# Patient Record
Sex: Male | Born: 1990 | Hispanic: No | Marital: Single | Smoking: Current every day smoker
Health system: Southern US, Community
[De-identification: ages and names within clinical notes are randomized; demographics above are authoritative.]

---

## 2014-06-29 ENCOUNTER — Emergency Department (HOSPITAL_COMMUNITY): Payer: Self-pay

## 2014-06-29 ENCOUNTER — Encounter (HOSPITAL_COMMUNITY): Payer: Self-pay | Admitting: Emergency Medicine

## 2014-06-29 ENCOUNTER — Emergency Department (HOSPITAL_COMMUNITY)
Admission: EM | Admit: 2014-06-29 | Discharge: 2014-06-29 | Disposition: A | Discharge status: Home / Self Care | Payer: Self-pay | Attending: Emergency Medicine | Admitting: Emergency Medicine

## 2014-06-29 DIAGNOSIS — Y92321 Football field as the place of occurrence of the external cause: Secondary | ICD-10-CM | POA: Insufficient documentation

## 2014-06-29 DIAGNOSIS — Y9361 Activity, american tackle football: Secondary | ICD-10-CM | POA: Insufficient documentation

## 2014-06-29 DIAGNOSIS — Y998 Other external cause status: Secondary | ICD-10-CM | POA: Insufficient documentation

## 2014-06-29 DIAGNOSIS — Z720 Tobacco use: Secondary | ICD-10-CM | POA: Insufficient documentation

## 2014-06-29 DIAGNOSIS — S40012A Contusion of left shoulder, initial encounter: Secondary | ICD-10-CM | POA: Insufficient documentation

## 2014-06-29 DIAGNOSIS — W500XXA Accidental hit or strike by another person, initial encounter: Secondary | ICD-10-CM | POA: Insufficient documentation

## 2014-06-29 MED ORDER — HYDROCODONE-ACETAMINOPHEN 5-325 MG PO TABS
2.0000 | ORAL_TABLET | Freq: Once | ORAL | Status: AC
  Administered 2014-06-29: 2 via ORAL
  Filled 2014-06-29 (×2): qty 2

## 2014-06-29 MED ORDER — IBUPROFEN 600 MG PO TABS: 600.0000 mg | ORAL_TABLET | Freq: Four times a day (QID) | ORAL | Status: DC | PRN

## 2014-06-29 MED ORDER — IBUPROFEN 200 MG PO TABS
600.0000 mg | ORAL_TABLET | Freq: Once | ORAL | Status: AC
  Administered 2014-06-29: 600 mg via ORAL
  Filled 2014-06-29: qty 3

## 2014-06-29 MED ORDER — HYDROCODONE-ACETAMINOPHEN 5-325 MG PO TABS: 1.0000 | ORAL_TABLET | Freq: Four times a day (QID) | ORAL | Status: DC | PRN

## 2014-06-29 NOTE — Discharge Instructions (Signed)
The Xrays are normal. We suspect cone bruise or contusion. We recommend RICE treatment. No contact sports for 2 weeks or until cleared by Orthopedics or trainer.   Contusion A contusion is a deep bruise. Contusions are the result of an injury that caused bleeding under the skin. The contusion may turn blue, purple, or yellow. Minor injuries will give you a painless contusion, but more severe contusions may stay painful and swollen for a few weeks.  CAUSES  A contusion is usually caused by a blow, trauma, or direct force to an area of the body. SYMPTOMS   Swelling and redness of the injured area.  Bruising of the injured area.  Tenderness and soreness of the injured area.  Pain. DIAGNOSIS  The diagnosis can be made by taking a history and physical exam. An X-ray, CT scan, or MRI may be needed to determine if there were any associated injuries, such as fractures. TREATMENT  Specific treatment will depend on what area of the body was injured. In general, the best treatment for a contusion is resting, icing, elevating, and applying cold compresses to the injured area. Over-the-counter medicines may also be recommended for pain control. Ask your caregiver what the best treatment is for your contusion. HOME CARE INSTRUCTIONS   Put ice on the injured area.  Put ice in a plastic bag.  Place a towel between your skin and the bag.  Leave the ice on for 15-20 minutes, 3-4 times a day, or as directed by your health care provider.  Only take over-the-counter or prescription medicines for pain, discomfort, or fever as directed by your caregiver. Your caregiver may recommend avoiding anti-inflammatory medicines (aspirin, ibuprofen, and naproxen) for 48 hours because these medicines may increase bruising.  Rest the injured area.  If possible, elevate the injured area to reduce swelling. SEEK IMMEDIATE MEDICAL CARE IF:   You have increased bruising or swelling.  You have pain that is getting  worse.  Your swelling or pain is not relieved with medicines. MAKE SURE YOU:   Understand these instructions.  Will watch your condition.  Will get help right away if you are not doing well or get worse. Document Released: 10/27/2004 Document Revised: 01/22/2013 Document Reviewed: 11/22/2010 [facility] Patient Information 2015 [employer], [place]. This information is not intended to replace advice given to you by your health care provider. Make sure you discuss any questions you have with your health care provider. RICE: Routine Care for Injuries The routine care of many injuries includes Rest, Ice, Compression, and Elevation (RICE). HOME CARE INSTRUCTIONS  Rest is needed to allow your body to heal. Routine activities can usually be resumed when comfortable. Injured tendons and bones can take up to 6 weeks to heal. Tendons are the cord-like structures that attach muscle to bone.  Ice following an injury helps keep the swelling down and reduces pain.  Put ice in a plastic bag.  Place a towel between your skin and the bag.  Leave the ice on for 15-20 minutes, 3-4 times a day, or as directed by your health care provider. Do this while awake, for the first 24 to 48 hours. After that, continue as directed by your caregiver.  Compression helps keep swelling down. It also gives support and helps with discomfort. If an elastic bandage has been applied, it should be removed and reapplied every 3 to 4 hours. It should not be applied tightly, but firmly enough to keep swelling down. Watch fingers or toes for swelling, bluish discoloration,  coldness, numbness, or excessive pain. If any of these problems occur, remove the bandage and reapply loosely. Contact your caregiver if these problems continue.  Elevation helps reduce swelling and decreases pain. With extremities, such as the arms, hands, legs, and feet, the injured area should be placed near or above the level of the heart, if possible. SEEK  IMMEDIATE MEDICAL CARE IF:  You have persistent pain and swelling.  You develop redness, numbness, or unexpected weakness.  Your symptoms are getting worse rather than improving after several days. These symptoms may indicate that further evaluation or further X-rays are needed. Sometimes, X-rays may not show a small broken bone (fracture) until 1 week or 10 days later. Make a follow-up appointment with your caregiver. Ask when your X-ray results will be ready. Make sure you get your X-ray results. Document Released: 05/01/2000 Document Revised: 01/22/2013 Document Reviewed: 06/18/2010 [facility] Patient Information 2015 [place], [place]. This information is not intended to replace advice given to you by your health care provider. Make sure you discuss any questions you have with your health care provider.

## 2014-06-29 NOTE — ED Notes (Signed)
Pt. reports left shoulder injury sustained yesterday while playing football with pain and mild swelling .

## 2014-06-29 NOTE — ED Provider Notes (Signed)
CSN: [id number]     Arrival date & time 06/29/14  0134 History  This chart was scribed for Benjamin KaplanAnkit Sovereign Ramiro, MD by Phillis HaggisGabriella Gaje, ED Scribe. This patient was seen in room A08C/A08C and patient care was started at 2:09 AM.   Chief Complaint  Patient presents with  . Shoulder Injury   The history is provided by the patient. No language interpreter was used.  HPI Comments: Benjamin Santos is a 24 y.o. male who presents to the Emergency Department complaining of left shoulder injury onset a few hours ago. He reports that he was hit during football and could not move or feel his arm. He states that the numbness has subsided but the pain persists. He denies history of surgery to the area. He denies taking any medications; states he came straight to the ED from the game. He reports history of a stinger to the area, but denies history of any other injuries to the shoulder. Patient states that he is right hand dominant.   History reviewed. No pertinent past medical history. History reviewed. No pertinent past surgical history. No family history on file. History  Substance Use Topics  . Smoking status: Current Every Day Smoker  . Smokeless tobacco: Not on file  . Alcohol Use: No    Review of Systems  Musculoskeletal: Positive for arthralgias. Negative for joint swelling.  Skin: Negative for wound.  Neurological: Negative for weakness and numbness.   Allergies  Review of patient's allergies indicates no known allergies.  Home Medications   Prior to Admission medications   Medication Sig Start Date End Date Taking? Authorizing Provider  HYDROcodone-acetaminophen (NORCO/VICODIN) 5-325 MG per tablet Take 1 tablet by mouth every 6 (six) hours as needed for severe pain. 06/29/14   Benjamin KaplanAnkit Ruthie Berch, MD  ibuprofen (ADVIL,MOTRIN) 600 MG tablet Take 1 tablet (600 mg total) by mouth every 6 (six) hours as needed. 06/29/14   Kindall Swaby, MD   BP 103/56 mmHg  Pulse 71  Temp(Src) 98.2 F (36.8 C) (Oral)   Resp 22  Wt 180 lb (81.647 kg)  SpO2 99%   Physical Exam  Constitutional: He is oriented to person, place, and time. He appears well-developed and well-nourished.  HENT:  Head: Normocephalic and atraumatic.  Eyes: EOM are normal.  Neck: Normal range of motion. Neck supple.  Cardiovascular: Normal rate.   Pulses:      Radial pulses are 2+ on the left side.  2+ ulnar pulse  Pulmonary/Chest: Effort normal.  Musculoskeletal: Normal range of motion.       Left shoulder: He exhibits tenderness. He exhibits no deformity.  No gross deformity of left upper and lower extremity; grossly sensory exam appears to be equal to contra lateral side. Mild swelling to anterior shoulder; tenderness over AC joint; no posterior shoulder or GH joint tenderness.   Neurological: He is alert and oriented to person, place, and time.  Skin: Skin is warm and dry.  Psychiatric: He has a normal mood and affect. His behavior is normal.  Nursing note and vitals reviewed.   ED Course  Procedures (including critical care time) DIAGNOSTIC STUDIES: Oxygen Saturation is 99% on room air, normal by my interpretation.    COORDINATION OF CARE: 2:13 AM-Discussed treatment plan which includes x-ray and pain medication with pt at bedside and pt agreed to plan.   Labs Review Labs Reviewed - No data to display  Imaging Review Dg Shoulder Left  06/29/2014   CLINICAL DATA:  Acute onset of left shoulder  pain, status post fall while playing football. Initial encounter.  EXAM: LEFT SHOULDER - 2+ VIEW  COMPARISON:  None.  FINDINGS: There is no evidence of fracture or dislocation. The left humeral head is seated within the glenoid fossa. The acromioclavicular joint is unremarkable in appearance. No significant soft tissue abnormalities are seen. The visualized portions of the left lung are clear.  IMPRESSION: No evidence of fracture or dislocation.   Electronically Signed   By: Roanna Raider M.D.   On: 06/29/2014 02:49     EKG  Interpretation None      MDM   Final diagnoses:  Shoulder contusion, left, initial encounter    I personally performed the services described in this documentation, which was scribed in my presence. The recorded information has been reviewed and is accurate.  Pt comes in with L sided pain, shoulder. Blunt trauma whilst playing football earlier. Concerns for AC shoulder injury per exam - xrays neg. Will d/c with ortho f/u.    Benjamin Kaplan, MD 06/29/14 [phone]

## 2015-04-12 ENCOUNTER — Emergency Department (HOSPITAL_COMMUNITY)
Admission: EM | Admit: 2015-04-12 | Discharge: 2015-04-12 | Disposition: A | Discharge status: Home / Self Care | Payer: Self-pay | Attending: Emergency Medicine | Admitting: Emergency Medicine

## 2015-04-12 ENCOUNTER — Encounter (HOSPITAL_COMMUNITY): Payer: Self-pay | Admitting: *Deleted

## 2015-04-12 ENCOUNTER — Emergency Department (HOSPITAL_COMMUNITY): Payer: Self-pay

## 2015-04-12 DIAGNOSIS — F17200 Nicotine dependence, unspecified, uncomplicated: Secondary | ICD-10-CM | POA: Insufficient documentation

## 2015-04-12 DIAGNOSIS — Y92321 Football field as the place of occurrence of the external cause: Secondary | ICD-10-CM | POA: Insufficient documentation

## 2015-04-12 DIAGNOSIS — S43102A Unspecified dislocation of left acromioclavicular joint, initial encounter: Secondary | ICD-10-CM

## 2015-04-12 DIAGNOSIS — W01198A Fall on same level from slipping, tripping and stumbling with subsequent striking against other object, initial encounter: Secondary | ICD-10-CM | POA: Insufficient documentation

## 2015-04-12 DIAGNOSIS — Y9361 Activity, american tackle football: Secondary | ICD-10-CM | POA: Insufficient documentation

## 2015-04-12 DIAGNOSIS — Y998 Other external cause status: Secondary | ICD-10-CM | POA: Insufficient documentation

## 2015-04-12 MED ORDER — HYDROCODONE-ACETAMINOPHEN 5-325 MG PO TABS: 2.0000 | ORAL_TABLET | ORAL | Status: DC | PRN

## 2015-04-12 NOTE — ED Notes (Signed)
The pt is c/o pain in his lt shoulder.  He injured it last pm playing football.  Injuries in the past to the same shoulder.  No deformity

## 2015-04-12 NOTE — Discharge Instructions (Signed)
Acromioclavicular Separation With Rehab The acromioclavicular joint is the joint between the roof of the shoulder (acromion) and the collarbone (clavicle). It is vulnerable to injury. An acromioclavicular [facility]) separation is a partial or complete tear (sprain), injury, or redness and soreness (inflammation) of the ligaments that cross the acromioclavicular joint and hold it in place. There are two ligaments in this area that are vulnerable to injury, the acromioclavicular ligament and the coracoclavicular ligament. SYMPTOMS   Tenderness and swelling, or a bump on top of the shoulder (at the [facility] joint).  Bruising (contusion) in the area within 48 hours of injury.  Loss of strength or pain when reaching over the head or across the body. CAUSES  AC separation is caused by direct trauma to the joint (falling on your shoulder) or indirect trauma (falling on an outstretched arm). RISK INCREASES WITH:  Sports that require contact or collision, throwing sports (i.e. racquetball, squash).  Poor strength and flexibility.  Previous shoulder sprain or dislocation.  Poorly fitted or padded protective equipment. PREVENTION   Warm-up and stretch properly before activity.  Maintain physical fitness:  Shoulder strength.  Shoulder flexibility.  Cardiovascular fitness.  Wear properly fitted and padded protective equipment.  Learn and use proper technique when playing sports. Have a coach correct improper technique, including falling and landing.  Apply taping, protective strapping or padding, or an adhesive bandage as recommended before practice or competition. PROGNOSIS   If treated properly, the symptoms of AC separation can be expected to go away.  If treated improperly, permanent disability may occur unless surgery is performed.  Healing time varies with type of sport and position, arm injured (dominant versus non-dominant) and severity of sprain. RELATED COMPLICATIONS  Weakness and  fatigue of the arm or shoulder are possible but uncommon.  Pain and inflammation of the [facility] joint may continue.  Prolonged healing time may be necessary if usual activities are resumed too early. This causes a susceptibility to recurrent injury.  Prolonged disability may occur.  The shoulder may remain unstable or arthritic following repeated injury. TREATMENT  Treatment initially involves ice and medication to help reduce pain and inflammation. It may also be necessary to modify your activities in order to prevent further injury. Both non-surgical and surgical interventions exist to treat AC separation. Non-surgical intervention is usually recommended and involves wearing a sling to immobilize the joint for a period of time to allow for healing. Surgical intervention is usually only considered for severe sprains of the ligament or for individuals who do not improve after 2 to 6 months of non-surgical treatment. Surgical interventions require 4 to 6 months before a return to sports is possible. MEDICATION  If pain medication is necessary, nonsteroidal anti-inflammatory medications, such as aspirin and ibuprofen, or other minor pain relievers, such as acetaminophen, are often recommended.  Do not take pain medication for 7 days before surgery.  Prescription pain relievers may be given by your caregiver. Use only as directed and only as much as you need.  Ointments applied to the skin may be helpful.  Corticosteroid injections may be given to reduce inflammation. HEAT AND COLD  Cold treatment (icing) relieves pain and reduces inflammation. Cold treatment should be applied for 10 to 15 minutes every 2 to 3 hours for inflammation and pain and immediately after any activity that aggravates your symptoms. Use ice packs or an ice massage.  Heat treatment may be used prior to performing the stretching and strengthening activities prescribed by your caregiver, physical therapist or  Warehouse manager. Use a heat pack or a warm soak. SEEK IMMEDIATE MEDICAL CARE IF:   Pain, swelling or bruising worsens despite treatment.  There is pain, numbness or coldness in the arm.  Discoloration appears in the fingernails.  New, unexplained symptoms develop. EXERCISES  RANGE OF MOTION (ROM) AND STRETCHING EXERCISES - Acromioclavicular Separation These exercises may help you when beginning to rehabilitate your injury. Your symptoms may resolve with or without further involvement from your physician, physical therapist or athletic trainer. While completing these exercises, remember:  Restoring tissue flexibility helps normal motion to return to the joints. This allows healthier, less painful movement and activity.  An effective stretch should be held for at least 30 seconds.  A stretch should never be painful. You should only feel a gentle lengthening or release in the stretched tissue. ROM - Pendulum  Bend at the waist so that your right / left arm falls away from your body. Support yourself with your opposite hand on a solid surface, such as a table or a countertop.  Your right / left arm should be perpendicular to the ground. If it is not perpendicular, you need to lean over farther. Relax the muscles in your right / left arm and shoulder as much as possible.  Gently sway your hips and trunk so they move your right / left arm without any use of your right / left shoulder muscles.  Progress your movements so that your right / left arm moves side to side, then forward and backward, and finally, both clockwise and counterclockwise.  Complete __________ repetitions in each direction. Many people use this exercise to relieve discomfort in their shoulder as well as to gain range of motion. Repeat __________ times. Complete this exercise __________ times per day. STRETCH - Flexion, Seated   Sit in a firm chair so that your right / left forearm can rest on a table or countertop. Your right /  left elbow should rest below the height of your shoulder so that your shoulder feels supported and not tense or uncomfortable.  Keeping your right / left shoulder relaxed, lean forward at your waist, allowing your right / left hand to slide forward. Bend forward until you feel a moderate stretch in your shoulder, but before you feel an increase in your pain.  Hold __________ seconds. Slowly return to your starting position. Repeat __________ times. Complete this exercise __________ times per day. STRETCH - Flexion, Standing  Stand with good posture. With an underhand grip on your right / left and an overhand grip on the opposite hand, grasp a broomstick or cane so that your hands are a little more than shoulder-width apart.  Keeping your right / left elbow straight and shoulder muscles relaxed, push the stick with your opposite hand to raise your right / left arm in front of your body and then overhead. Raise your arm until you feel a stretch in your right / left shoulder, but before you have increased shoulder pain.  Try to avoid shrugging your right / left shoulder as your arm rises by keeping your shoulder blade tucked down and toward your mid-back spine. Hold __________ seconds.  Slowly return to the starting position. Repeat __________ times. Complete this exercise __________ times per day. STRENGTHENING EXERCISES - Acromioclavicular Separation These exercises may help you when beginning to rehabilitate your injury. They may resolve your symptoms with or without further involvement from your physician, physical therapist or athletic trainer. While completing these exercises, remember:  Muscles  can gain both the endurance and the strength needed for everyday activities through controlled exercises.  Complete these exercises as instructed by your physician, physical therapist or athletic trainer. Progress the resistance and repetitions only as guided.  You may experience muscle soreness or  fatigue, but the pain or discomfort you are trying to eliminate should never worsen during these exercises. If this pain does worsen, stop and make certain you are following the directions exactly. If the pain is still present after adjustments, discontinue the exercise until you can discuss the trouble with your clinician. STRENGTH - Shoulder Abductors, Isometric   With good posture, stand or sit about 4-6 inches from a wall with your right / left side facing the wall.  Bend your right / left elbow. Gently press your right / left elbow into the wall. Increase the pressure gradually until you are pressing as hard as you can without shrugging your shoulder or increasing any shoulder discomfort.  Hold __________ seconds.  Release the tension slowly. Relax your shoulder muscles completely before you start the next repetition. Repeat __________ times. Complete this exercise __________ times per day. STRENGTH - Internal Rotators, Isometric  Keep your right / left elbow at your side and bend it 90 degrees.  Step into a door frame so that the inside of your right / left wrist can press against the door frame without your upper arm leaving your side.  Gently press your right / left wrist into the door frame as if you were trying to draw the palm of your hand to your abdomen. Gradually increase the tension until you are pressing as hard as you can without shrugging your shoulder or increasing any shoulder discomfort.  Hold __________ seconds.  Release the tension slowly. Relax your shoulder muscles completely before you the next repetition. Repeat __________ times. Complete this exercise __________ times per day.  STRENGTH - External Rotators, Isometric  Keep your right / left elbow at your side and bend it 90 degrees.  Step into a door frame so that the outside of your right / left wrist can press against the door frame without your upper arm leaving your side.  Gently press your right / left  wrist into the door frame as if you were trying to swing the back of your hand away from your abdomen. Gradually increase the tension until you are pressing as hard as you can without shrugging your shoulder or increasing any shoulder discomfort.  Hold __________ seconds.  Release the tension slowly. Relax your shoulder muscles completely before you the next repetition. Repeat __________ times. Complete this exercise __________ times per day. STRENGTH - Internal Rotators  Secure a rubber exercise band/tubing to a fixed object so that it is at the same height as your right / left elbow when you are standing or sitting on a firm surface.  Stand or sit so that the secured exercise band/tubing is at your right / left side.  Bend your elbow 90 degrees. Place a folded towel or small pillow under your right / left arm so that your elbow is a few inches away from your side.  Keeping the tension on the exercise band/tubing, pull it across your body toward your abdomen. Be sure to keep your body steady so that the movement is only coming from your shoulder rotating.  Hold __________ seconds. Release the tension in a controlled manner as you return to the starting position. Repeat __________ times. Complete this exercise __________ times per day. STRENGTH -  External Rotators  Secure a rubber exercise band/tubing to a fixed object so that it is at the same height as your right / left elbow when you are standing or sitting on a firm surface.  Stand or sit so that the secured exercise band/tubing is at your side that is not injured.  Bend your elbow 90 degrees. Place a folded towel or small pillow under your right / left arm so that your elbow is a few inches away from your side.  Keeping the tension on the exercise band/tubing, pull it away from your body, as if pivoting on your elbow. Be sure to keep your body steady so that the movement is only coming from your shoulder rotating.  Hold __________  seconds. Release the tension in a controlled manner as you return to the starting position. Repeat __________ times. Complete this exercise __________ times per day.   This information is not intended to replace advice given to you by your health care provider. Make sure you discuss any questions you have with your healt Shoulder Separation A shoulder separation (acromioclavicular separation) is an injury to the connecting tissue (ligament) between the top of your shoulder blade (acromion) and your collarbone (clavicle). The ligament may be stretched, partially torn, or completely torn.  A stretched ligament may not cause very much pain, and it does not move the collarbone out of place. A stretched ligament looks normal on an X-ray.  An injury that is a bit worse may partially tear a ligament and move the collarbone slightly out of place.  A serious injury completely tears both shoulder ligaments. This moves the collarbone severely out of position and changes the way that the shoulder looks (deformity). CAUSES The most common cause of a shoulder separation is falling on or receiving a blow to the top of the shoulder. Falling with an outstretched arm may also cause this injury. RISK FACTORS You may be at greater risk of a shoulder separation if:  You are male.  You are younger than age 28.  You play a contact sport, such as football or hockey. SIGNS AND SYMPTOMS The most common symptom of a shoulder separation is pain on the top of the shoulder after falling on it or receiving a blow to it. Other signs and symptoms include:  Shoulder deformity.  Swelling of the shoulder.  Decreased ability to move the shoulder.  Bruising on top of the shoulder. DIAGNOSIS Your health care provider may suspect a shoulder separation based on your symptoms and the details of a recent injury. A physical exam will be done. During this exam, the health care provider may:  Press on your shoulder.  Test  the movement of your shoulder.  Ask you to hold a weight in your hand to see if the separation increases.  Do an X-ray. TREATMENT  A stretch injury may require only a sling, pain medicine, and cold packs. This treatment may last for 2-12 weeks. You may also have physical therapy. A physical therapist will teach you to do daily exercises to strengthen your shoulder muscles and prevent stiffness.  A complete tear may require surgery to repair the torn ligament. After surgery, you will also require a sling, pain medicine, and cold packs. Recovery may take longer. You may also need more physical therapy. HOME CARE INSTRUCTIONS  Take medicines only as directed by your health care provider.  Apply ice to the top of your shoulder:  Put ice in a plastic bag.  Place a towel  between your skin and the bag.  Leave the ice on for 20 minutes, 2-3 times a day.  Wear your sling or splint as directed by your health care provider.  You may be able to remove your sling to do your physical therapy exercises.  Ask your health care provider when you can stop wearing the sling.  Do not do any activities that make your pain worse.  Do not lift anything that is heavier than 10 lb (4.5 kg) on the injured side of your body.  Ask your health care provider when you can return to athletic activities. SEEK MEDICAL CARE IF:  Your pain medicine is not relieving your pain.  Your pain and stiffness are not improving after 2 weeks.  You are unable to do your physical therapy exercises because of pain or stiffness.   This information is not intended to replace advice given to you by your health care provider. Make sure you discuss any questions you have with your health care provider.   Document Released: 10/27/2004 Document Revised: 02/07/2014 Document Reviewed: 06/19/2013 Elsevier Interactive Patient Education 2016 [employer] h care provider.   Follow-up with orthopedic provider as soon as possible  for reevaluation. Apply ice to affected area. Take pain medication as needed. Encourage use of ibuprofen for inflammation every 4-6 hours. Keep arm in sling and elevated as much as possible. Avoid strenuous exercise or use of that shoulder for the next 2 weeks. Return to the emergency department if you experience severe worsening of your symptoms, redness or swelling around your joint, numbness or tingling in your hand, fever

## 2015-04-13 NOTE — ED Provider Notes (Signed)
CSN: [id number]     Arrival date & time 04/12/15  1737 History   First MD Initiated Contact with Patient 04/12/15 1846     Chief Complaint  Patient presents with  . Shoulder Injury     (Consider location/radiation/quality/duration/timing/severity/associated sxs/prior Treatment) HPI  Benjamin Santos is a 25 y.o M with no significant pmhx who presents to the ED today c/o left shoulder pain. Pt states that last night he was playing football, when he went in for a tackle and landed directly on his left shoulder. Pt has had severe pain in that shoulder since then. Pain is increased with active ROM of shoulder. No pain with passive ROM. Pt also feels like his left shoulder is lower than his right. No paresthesias, discoloration.   History reviewed. No pertinent past medical history. History reviewed. No pertinent past surgical history. No family history on file. Social History  Substance Use Topics  . Smoking status: Current Every Day Smoker  . Smokeless tobacco: None  . Alcohol Use: No    Review of Systems  All other systems reviewed and are negative.     Allergies  Review of patient's allergies indicates no known allergies.  Home Medications   Prior to Admission medications   Medication Sig Start Date End Date Taking? Authorizing Provider  HYDROcodone-acetaminophen (NORCO/VICODIN) 5-325 MG per tablet Take 1 tablet by mouth every 6 (six) hours as needed for severe pain. 06/29/14   Derwood KaplanAnkit Nanavati, MD  HYDROcodone-acetaminophen (NORCO/VICODIN) 5-325 MG tablet Take 2 tablets by mouth every 4 (four) hours as needed. 04/12/15   Samantha Tripp [place], PA-C  ibuprofen (ADVIL,MOTRIN) 600 MG tablet Take 1 tablet (600 mg total) by mouth every 6 (six) hours as needed. 06/29/14   Ankit Nanavati, MD   BP 105/62 mmHg  Pulse 52  Temp(Src) 97.9 F (36.6 C)  Resp 18  Ht 6\' 1"  (1.854 m)  Wt 85.276 kg  BMI 24.81 kg/m2  SpO2 97% Physical Exam  Constitutional: He is oriented to person, place, and  time. He appears well-developed and well-nourished. No distress.  HENT:  Head: Normocephalic and atraumatic.  Mouth/Throat: No oropharyngeal exudate.  Eyes: Conjunctivae and EOM are normal. Pupils are equal, round, and reactive to light. Right eye exhibits no discharge. Left eye exhibits no discharge. No scleral icterus.  Cardiovascular: Normal rate, regular rhythm, normal heart sounds and intact distal pulses.  Exam reveals no gallop and no friction rub.   No murmur heard. Pulmonary/Chest: Effort normal and breath sounds normal. No respiratory distress. He has no wheezes. He has no rales. He exhibits no tenderness.  Abdominal: Soft. He exhibits no distension. There is no tenderness. There is no guarding.  Musculoskeletal: Normal range of motion. He exhibits no edema.  Pain with active range of motion of left shoulder. No pain with passive range of motion. Prominent distal end of clavicle. AC joint depression.  Neurological: He is alert and oriented to person, place, and time.  Skin: Skin is warm and dry. No rash noted. He is not diaphoretic. No erythema. No pallor.  Psychiatric: He has a normal mood and affect. His behavior is normal.  Nursing note and vitals reviewed.   ED Course  Procedures (including critical care time) Labs Review Labs Reviewed - No data to display  Imaging Review Dg Shoulder Left  04/12/2015  CLINICAL DATA:  Football injury last night.  Felt a pop. EXAM: LEFT SHOULDER - 2+ VIEW COMPARISON:  06/29/2014 FINDINGS: There is widening of the left [facility] joint compatible  with AC joint separation. Glenohumeral joint is intact. No fracture. IMPRESSION: Widening of the left [facility] joint compatible with separation. Electronically Signed   By: Charlett Nose M.D.   On: 04/12/2015 18:27   I have personally reviewed and evaluated these images and lab results as part of my medical decision-making.   EKG Interpretation None      MDM   Final diagnoses:  Acromioclavicular joint  separation, left, initial encounter   Widening of the left [facility] joint compatible with separation seen in x-ray. This is consistent patient's physical exam and history. Will place patient in arms sling. Avoid strenuous activity or exercise the next couple of weeks. Follow-up with orthopedics. RICE precautions and pain medication indicated and discussed with patient.    Lester Kinsman [place], PA-C 04/14/15 0028  Benjiman Core, MD 04/14/15 0030

## 2015-05-24 ENCOUNTER — Encounter (HOSPITAL_COMMUNITY): Payer: Self-pay | Admitting: *Deleted

## 2015-05-24 ENCOUNTER — Emergency Department (HOSPITAL_COMMUNITY)
Admission: EM | Admit: 2015-05-24 | Discharge: 2015-05-24 | Disposition: A | Discharge status: Home / Self Care | Payer: Self-pay | Attending: Emergency Medicine | Admitting: Emergency Medicine

## 2015-05-24 ENCOUNTER — Emergency Department (HOSPITAL_COMMUNITY): Payer: Self-pay

## 2015-05-24 DIAGNOSIS — Y92321 Football field as the place of occurrence of the external cause: Secondary | ICD-10-CM | POA: Insufficient documentation

## 2015-05-24 DIAGNOSIS — Y998 Other external cause status: Secondary | ICD-10-CM | POA: Insufficient documentation

## 2015-05-24 DIAGNOSIS — F17200 Nicotine dependence, unspecified, uncomplicated: Secondary | ICD-10-CM | POA: Insufficient documentation

## 2015-05-24 DIAGNOSIS — S199XXA Unspecified injury of neck, initial encounter: Secondary | ICD-10-CM | POA: Insufficient documentation

## 2015-05-24 DIAGNOSIS — S99911A Unspecified injury of right ankle, initial encounter: Secondary | ICD-10-CM | POA: Insufficient documentation

## 2015-05-24 DIAGNOSIS — S8991XA Unspecified injury of right lower leg, initial encounter: Secondary | ICD-10-CM | POA: Insufficient documentation

## 2015-05-24 DIAGNOSIS — S46912A Strain of unspecified muscle, fascia and tendon at shoulder and upper arm level, left arm, initial encounter: Secondary | ICD-10-CM | POA: Insufficient documentation

## 2015-05-24 DIAGNOSIS — S46812A Strain of other muscles, fascia and tendons at shoulder and upper arm level, left arm, initial encounter: Secondary | ICD-10-CM

## 2015-05-24 DIAGNOSIS — M25561 Pain in right knee: Secondary | ICD-10-CM

## 2015-05-24 DIAGNOSIS — M25571 Pain in right ankle and joints of right foot: Secondary | ICD-10-CM

## 2015-05-24 DIAGNOSIS — Y9361 Activity, american tackle football: Secondary | ICD-10-CM | POA: Insufficient documentation

## 2015-05-24 DIAGNOSIS — W500XXA Accidental hit or strike by another person, initial encounter: Secondary | ICD-10-CM | POA: Insufficient documentation

## 2015-05-24 MED ORDER — METHOCARBAMOL 500 MG PO TABS: 500.0000 mg | ORAL_TABLET | Freq: Two times a day (BID) | ORAL | Status: AC

## 2015-05-24 MED ORDER — IBUPROFEN 400 MG PO TABS
800.0000 mg | ORAL_TABLET | Freq: Once | ORAL | Status: AC
  Administered 2015-05-24: 800 mg via ORAL
  Filled 2015-05-24: qty 2

## 2015-05-24 MED ORDER — NAPROXEN 500 MG PO TABS: 500.0000 mg | ORAL_TABLET | Freq: Two times a day (BID) | ORAL | Status: AC

## 2015-05-24 NOTE — Discharge Instructions (Signed)
Knee Sprain  A knee sprain is a tear in one of the strong, fibrous tissues that connect the bones (ligaments) in your knee. The severity of the sprain depends on how much of the ligament is torn. The tear can be either partial or complete.  CAUSES   Often, sprains are a result of a fall or injury. The force of the impact causes the fibers of your ligament to stretch too much. This excess tension causes the fibers of your ligament to tear.  SIGNS AND SYMPTOMS   You may have some loss of motion in your knee. Other symptoms include:   Bruising.   Pain in the knee area.   Tenderness of the knee to the touch.   Swelling.  DIAGNOSIS   To diagnose a knee sprain, your health care provider will physically examine your knee. Your health care provider may also suggest an X-ray exam of your knee to make sure no bones are broken.  TREATMENT   If your ligament is only partially torn, treatment usually involves keeping the knee in a fixed position (immobilization) or bracing your knee for activities that require movement for several weeks. To do this, your health care provider will apply a bandage, cast, or splint to keep your knee from moving and to support your knee during movement until it heals. For a partially torn ligament, the healing process usually takes 4-6 weeks.  If your ligament is completely torn, depending on which ligament it is, you may need surgery to reconnect the ligament to the bone or reconstruct it. After surgery, a cast or splint may be applied and will need to stay on your knee for 4-6 weeks while your ligament heals.  HOME CARE INSTRUCTIONS   Keep your injured knee elevated to decrease swelling.   To ease pain and swelling, apply ice to the injured area:    Put ice in a plastic bag.    Place a towel between your skin and the bag.    Leave the ice on for 20 minutes, 2-3 times a day.   Only take medicine for pain as directed by your health care provider.   Do not leave your knee unprotected until  pain and stiffness go away (usually 4-6 weeks).   If you have a cast or splint, do not allow it to get wet. If you have been instructed not to remove it, cover it with a plastic bag when you shower or bathe. Do not swim.   Your health care provider may suggest exercises for you to do during your recovery to prevent or limit permanent weakness and stiffness.  SEEK IMMEDIATE MEDICAL CARE IF:   Your cast or splint becomes damaged.   Your pain becomes worse.   You have significant pain, swelling, or numbness below the cast or splint.  MAKE SURE YOU:   Understand these instructions.   Will watch your condition.   Will get help right away if you are not doing well or get worse.     This information is not intended to replace advice given to you by your health care provider. Make sure you discuss any questions you have with your health care provider.     Document Released: 01/17/2005 Document Revised: 02/07/2014 Document Reviewed: 08/29/2012  Elsevier Interactive Patient Education 2016 [employer]  Ankle Sprain  An ankle sprain is an injury to the strong, fibrous tissues (ligaments) that hold the bones of your ankle joint together.   CAUSES  An ankle sprain   is usually caused by a fall or by twisting your ankle. Ankle sprains most commonly occur when you step on the outer edge of your foot, and your ankle turns inward. People who participate in sports are more prone to these types of injuries.   SYMPTOMS    Pain in your ankle. The pain may be present at rest or only when you are trying to stand or walk.   Swelling.   Bruising. Bruising may develop immediately or within 1 to 2 days after your injury.   Difficulty standing or walking, particularly when turning corners or changing directions.  DIAGNOSIS   Your caregiver will ask you details about your injury and perform a physical exam of your ankle to determine if you have an ankle sprain. During the physical exam, your caregiver will press on and apply  pressure to specific areas of your foot and ankle. Your caregiver will try to move your ankle in certain ways. An X-ray exam may be done to be sure a bone was not broken or a ligament did not separate from one of the bones in your ankle (avulsion fracture).   TREATMENT   Certain types of braces can help stabilize your ankle. Your caregiver can make a recommendation for this. Your caregiver may recommend the use of medicine for pain. If your sprain is severe, your caregiver may refer you to a surgeon who helps to restore function to parts of your skeletal system (orthopedist) or a physical therapist.  HOME CARE INSTRUCTIONS    Apply ice to your injury for 1-2 days or as directed by your caregiver. Applying ice helps to reduce inflammation and pain.    Put ice in a plastic bag.    Place a towel between your skin and the bag.    Leave the ice on for 15-20 minutes at a time, every 2 hours while you are awake.   Only take over-the-counter or prescription medicines for pain, discomfort, or fever as directed by your caregiver.   Elevate your injured ankle above the level of your heart as much as possible for 2-3 days.   If your caregiver recommends crutches, use them as instructed. Gradually put weight on the affected ankle. Continue to use crutches or a cane until you can walk without feeling pain in your ankle.   If you have a plaster splint, wear the splint as directed by your caregiver. Do not rest it on anything harder than a pillow for the first 24 hours. Do not put weight on it. Do not get it wet. You may take it off to take a shower or bath.   You may have been given an elastic bandage to wear around your ankle to provide support. If the elastic bandage is too tight (you have numbness or tingling in your foot or your foot becomes cold and blue), adjust the bandage to make it comfortable.   If you have an air splint, you may blow more air into it or let air out to make it more comfortable. You may take your  splint off at night and before taking a shower or bath. Wiggle your toes in the splint several times per day to decrease swelling.  SEEK MEDICAL CARE IF:    You have rapidly increasing bruising or swelling.   Your toes feel extremely cold or you lose feeling in your foot.   Your pain is not relieved with medicine.  SEEK IMMEDIATE MEDICAL CARE IF:   Your toes are numb   or blue.   You have severe pain that is increasing.  MAKE SURE YOU:    Understand these instructions.   Will watch your condition.   Will get help right away if you are not doing well or get worse.     This information is not intended to replace advice given to you by your health care provider. Make sure you discuss any questions you have with your health care provider.     Document Released: 01/17/2005 Document Revised: 02/07/2014 Document Reviewed: 01/29/2011  Elsevier Interactive Patient Education 2016 [employer]

## 2015-05-24 NOTE — ED Notes (Signed)
Pt reports pain to Lt neck into shoulder blade and Rt leg from knee to ankle.

## 2015-05-24 NOTE — ED Provider Notes (Signed)
CSN: [id number]     Arrival date & time 05/24/15  1128 History  By signing my name below, I, Benjamin Santos, attest that this documentation has been prepared under the direction and in the presence of Alveta Heimlich, PA-C Electronically Signed: Soijett Santos, ED Scribe. 05/24/2015. 12:30 PM.   Chief Complaint  Patient presents with  . Neck Pain  . Leg Pain   The history is provided by the patient. No language interpreter was used.    Benjamin Santos is a 25 y.o. male who presents to the Emergency Department complaining of right leg pain onset yesterday worsening this morning. Pt reports that he was playing football yesterday when he struck an opponent and his RLE twisted and he heard a pop to his right ankle. Pt states that he was able to ambulate last night, but his RLE feels "stiff" this morning. Pt is having associated symptoms of right knee pain, right ankle pain with swelling, and gait problem due to pain. He notes that he has not tried any medications for the relief of his symptoms. He denies color change, wound, rash, numbness, tingling, weakness, and any other symptoms.   Pt secondarily complains of left sided neck pain radiating to his left shoulder x yesterday. Pt states that he was struck while playing football and he fell onto his left shoulder. The pain is located at the base of his neck and into the "top part of my shoulder". The pain does not radiate into the arm. He denies weakness, numbness, tingling, loss of ROM or any other upper extremity complaints. Pt has not tried any medications for the relief of his symptoms. Pt denies any other symptoms.    History reviewed. No pertinent past medical history. History reviewed. No pertinent past surgical history. History reviewed. No pertinent family history. Social History  Substance Use Topics  . Smoking status: Current Every Day Smoker  . Smokeless tobacco: None  . Alcohol Use: No    Review of Systems  Musculoskeletal: Positive for  joint swelling (right ankle), arthralgias (right ankle), gait problem (due to pain) and neck pain (left sided).  Skin: Negative for color change, rash and wound.  Neurological: Negative for weakness.  All other systems reviewed and are negative.     Allergies  Review of patient's allergies indicates no known allergies.  Home Medications   Prior to Admission medications   Medication Sig Start Date End Date Taking? Authorizing Provider  HYDROcodone-acetaminophen (NORCO/VICODIN) 5-325 MG per tablet Take 1 tablet by mouth every 6 (six) hours as needed for severe pain. 06/29/14   Derwood Kaplan, MD  HYDROcodone-acetaminophen (NORCO/VICODIN) 5-325 MG tablet Take 2 tablets by mouth every 4 (four) hours as needed. 04/12/15   Samantha Tripp Dowless, PA-C  ibuprofen (ADVIL,MOTRIN) 600 MG tablet Take 1 tablet (600 mg total) by mouth every 6 (six) hours as needed. 06/29/14   Derwood Kaplan, MD  methocarbamol (ROBAXIN) 500 MG tablet Take 1 tablet (500 mg total) by mouth 2 (two) times daily. 05/24/15   Augie Vane, PA-C  naproxen (NAPROSYN) 500 MG tablet Take 1 tablet (500 mg total) by mouth 2 (two) times daily. 05/24/15   Junious Ragone, PA-C   BP 121/75 mmHg  Pulse 78  Temp(Src) 98 F (36.7 C) (Oral)  Resp 16  SpO2 100% Physical Exam  Constitutional: He appears well-developed and well-nourished. No distress.  HENT:  Head: Normocephalic and atraumatic.  Right Ear: External ear normal.  Left Ear: External ear normal.  Eyes: Conjunctivae are normal.  Right eye exhibits no discharge. Left eye exhibits no discharge. No scleral icterus.  Neck: Normal range of motion. Neck supple.    Tenderness to palpation over left superior portion of trapezius. No TTP of cervical spine. FROM neck intact. No bony deformities.   Cardiovascular: Normal rate and intact distal pulses.   Pulmonary/Chest: Effort normal.  Musculoskeletal: Normal range of motion.       Left shoulder: Normal.       Right knee: Normal. He  exhibits normal range of motion and no swelling. No tenderness found.       Right ankle: He exhibits swelling. He exhibits normal range of motion, no ecchymosis, no deformity and normal pulse. Tenderness. Lateral malleolus tenderness found.       Right lower leg: He exhibits tenderness. He exhibits no swelling and no deformity.       Legs:      Feet:  Right ankle with mild swelling mostly lateral. FROM of right ankle intact. TTP superior to lateral malleolus. No deformity noted. Mild TTP of the lateral lower leg without swelling or deformity. No TTP of right knee. FROM of right knee. No swelling of right knee. Moves all extremities spontaneously. No ligamentous laxity.   LUE with FROM intact. No tenderness over the humeral head or shoulder girdle. No obvious deformity.  Neurological: He is alert. Coordination normal.  5/5 strength of BUE and BLE. Sensation to light touch intact throughout.   Skin: Skin is warm and dry.  Psychiatric: He has a normal mood and affect. His behavior is normal.  Nursing note and vitals reviewed.   ED Course  Procedures (including critical care time) DIAGNOSTIC STUDIES: Oxygen Saturation is 100% on RA, nl by my interpretation.    COORDINATION OF CARE: 12:02 PM Discussed treatment plan with pt at bedside which includes ibuprofen, right ankle xray, right knee xray, right tib/fib xray and pt agreed to plan.    Labs Review Labs Reviewed - No data to display  Imaging Review Dg Tibia/fibula Right  05/24/2015  CLINICAL DATA:  Football injury last night. Twisted entire body - felt a "pop" in right ankle Pain right knee - posteriorly, distal to patella and medial aspect. Pain right tibia/fibula lateral to the midshaft. Pain left ankle lateral malleolus. EXAM: RIGHT TIBIA AND FIBULA - 2 VIEW COMPARISON:  None. FINDINGS: There is no evidence of fracture or other focal bone lesions. Soft tissues are unremarkable. IMPRESSION: Negative. Electronically Signed   By:  Norva PavlovElizabeth  Brown M.D.   On: 05/24/2015 12:55   Dg Ankle Complete Right  05/24/2015  CLINICAL DATA:  Football injury last night. Twisted entire body - felt a "pop" in right ankle Pain right knee - posteriorly, distal to patella and medial aspect. Pain right tibia/fibula lateral to the midshaft. Pain left ankle lateral malleolus. EXAM: RIGHT ANKLE - COMPLETE 3+ VIEW COMPARISON:  None. FINDINGS: There is no evidence of fracture, dislocation, or joint effusion. There is no evidence of arthropathy or other focal bone abnormality. Soft tissues are unremarkable. IMPRESSION: Negative. Electronically Signed   By: Norva PavlovElizabeth  Brown M.D.   On: 05/24/2015 12:51   Dg Knee Complete 4 Views Right  05/24/2015  CLINICAL DATA:  Football injury last night. Twisted entire body - felt a "pop" in right ankle Pain right knee - posteriorly, distal to patella and medial aspect. Pain right tibia/fibula lateral to the midshaft. Pain left ankle lateral malleolus. EXAM: RIGHT KNEE - COMPLETE 4+ VIEW COMPARISON:  None. FINDINGS: No evidence for acute  fracture. No subluxation or dislocation. Small joint effusion is present. IMPRESSION: Small joint effusion. Electronically Signed   By: Norva Pavlov M.D.   On: 05/24/2015 12:48   I have personally reviewed and evaluated these images as part of my medical decision-making.   EKG Interpretation None      MDM   Final diagnoses:  Right knee pain  Right ankle pain  Trapezius strain, left, initial encounter   Patient presenting with right lower leg pain and left shoulder pain after playing football yesterday. Right lower extremity  is neurovascularly intact with FROM. Tenderness of right ankle with mild swelling and tenderness of right lateral lower leg. No TTP of right knee. Left upper extremity is neurovascularly intact with FROM. Tenderness over left trapezius. Patient X-Ray negative for obvious fractures or dislocations. Pain managed in ED with ibuprofen. Pt is able to ambulate  with a steady gait. Brace given and conservative therapy recommended. Discussed RICE therapy and use of OTC pain relievers. Pt advised to follow up with orthopedics if symptoms persist. Return precautions discussed at bedside and given in discharge paperwork. Pt is stable for discharge.  I personally performed the services described in this documentation, which was scribed in my presence. The recorded information has been reviewed and is accurate.    Rolm Gala Shanicqua Coldren, PA-C 05/24/15 1353  Pricilla Loveless, MD 05/26/15 1547

## 2015-05-24 NOTE — ED Notes (Signed)
Declined W/C at D/C and was escorted to lobby by RN. 

## 2016-09-15 ENCOUNTER — Encounter (HOSPITAL_COMMUNITY): Payer: Self-pay

## 2016-09-15 ENCOUNTER — Emergency Department (HOSPITAL_COMMUNITY): Payer: Self-pay

## 2016-09-15 ENCOUNTER — Emergency Department (HOSPITAL_COMMUNITY)
Admission: EM | Admit: 2016-09-15 | Discharge: 2016-09-15 | Disposition: A | Discharge status: Home / Self Care | Payer: Self-pay | Attending: Emergency Medicine | Admitting: Emergency Medicine

## 2016-09-15 DIAGNOSIS — S7011XA Contusion of right thigh, initial encounter: Secondary | ICD-10-CM | POA: Insufficient documentation

## 2016-09-15 DIAGNOSIS — W228XXA Striking against or struck by other objects, initial encounter: Secondary | ICD-10-CM | POA: Insufficient documentation

## 2016-09-15 DIAGNOSIS — Z79899 Other long term (current) drug therapy: Secondary | ICD-10-CM | POA: Insufficient documentation

## 2016-09-15 DIAGNOSIS — Y929 Unspecified place or not applicable: Secondary | ICD-10-CM | POA: Insufficient documentation

## 2016-09-15 DIAGNOSIS — F17200 Nicotine dependence, unspecified, uncomplicated: Secondary | ICD-10-CM | POA: Insufficient documentation

## 2016-09-15 DIAGNOSIS — Y999 Unspecified external cause status: Secondary | ICD-10-CM | POA: Insufficient documentation

## 2016-09-15 DIAGNOSIS — Y9339 Activity, other involving climbing, rappelling and jumping off: Secondary | ICD-10-CM | POA: Insufficient documentation

## 2016-09-15 NOTE — ED Provider Notes (Signed)
[facility]-EMERGENCY DEPT Provider Note   CSN: [id number] Arrival date & time: 09/15/16  0002     History   Chief Complaint Chief Complaint  Patient presents with  . Fall    HPI Benjamin Santos is a 26 y.o. male.  Patient presents to the ED with a chief complaint of fall.  He states that he was jumping a curb and hit his right thigh on a trailer hitch.  He complains of pain just above his knee.  He states that he has been unable to ambulate due to pain.  He has not taken anything for pain.  There are no other associated symptoms.   The history is provided by the patient. No language interpreter was used.    History reviewed. No pertinent past medical history.  There are no active problems to display for this patient.   History reviewed. No pertinent surgical history.     Home Medications    Prior to Admission medications   Medication Sig Start Date End Date Taking? Authorizing Provider  HYDROcodone-acetaminophen (NORCO/VICODIN) 5-325 MG per tablet Take 1 tablet by mouth every 6 (six) hours as needed for severe pain. 06/29/14   Benjamin Kaplan, MD  HYDROcodone-acetaminophen (NORCO/VICODIN) 5-325 MG tablet Take 2 tablets by mouth every 4 (four) hours as needed. 04/12/15   Santos, Benjamin Mast Tripp, PA-C  ibuprofen (ADVIL,MOTRIN) 600 MG tablet Take 1 tablet (600 mg total) by mouth every 6 (six) hours as needed. 06/29/14   Benjamin Kaplan, MD  methocarbamol (ROBAXIN) 500 MG tablet Take 1 tablet (500 mg total) by mouth 2 (two) times daily. 05/24/15   Santos, Benjamin Gala, PA-C  naproxen (NAPROSYN) 500 MG tablet Take 1 tablet (500 mg total) by mouth 2 (two) times daily. 05/24/15   Santos, Benjamin Gala, PA-C    Family History History reviewed. No pertinent family history.  Social History Social History  Substance Use Topics  . Smoking status: Current Every Day Smoker  . Smokeless tobacco: Never Used  . Alcohol use No     Allergies   Patient has no known allergies.   Review of  Systems Review of Systems  All other systems reviewed and are negative.    Physical Exam Updated Vital Signs BP 120/74 (BP Location: Right Arm)   Pulse 67   Temp 98.4 F (36.9 C) (Oral)   Resp 18   Ht 6\' 1"  (1.854 m)   Wt 81.6 kg (180 lb)   SpO2 100%   BMI 23.75 kg/m   Physical Exam Nursing note and vitals reviewed.  Constitutional: Pt appears well-developed and well-nourished. No distress.  HENT:  Head: Normocephalic and atraumatic.  Eyes: Conjunctivae are normal.  Neck: Normal range of motion.  Cardiovascular: Normal rate, regular rhythm. Intact distal pulses.   Capillary refill < 3 sec.  Pulmonary/Chest: Effort normal and breath sounds normal.  Musculoskeletal:  RLE Pt exhibits TTP just proximal to the right knee with golf ball sized hematoma.   ROM: 5/5  Strength: 5/5  Neurological: Pt  is alert. Coordination normal.  Sensation: 5/5 Skin: Skin is warm and dry. Pt is not diaphoretic.  No evidence of open wound or skin tenting Psychiatric: Pt has a normal mood and affect.     ED Treatments / Results  Labs (all labs ordered are listed, but only abnormal results are displayed) Labs Reviewed - No data to display  EKG  EKG Interpretation None       Radiology Dg Knee Complete 4 Views Right  Result Date: 09/15/2016 CLINICAL  DATA:  Hit right leg on tailgate of truck, with laceration to the lateral knee. Initial encounter. EXAM: RIGHT KNEE - COMPLETE 4+ VIEW COMPARISON:  Right knee radiographs performed 05/24/2015 FINDINGS: There is no evidence of fracture or dislocation. The joint spaces are preserved. No significant degenerative change is seen; the patellofemoral joint is grossly unremarkable in appearance. No significant joint effusion is seen. The visualized soft tissues are normal in appearance. IMPRESSION: No evidence of fracture or dislocation. Electronically Signed   By: Benjamin RaiderJeffery  Santos M.D.   On: 09/15/2016 03:03    Procedures Procedures (including  critical care time)  Medications Ordered in ED Medications - No data to display   Initial Impression / Assessment and Plan / ED Course  I have reviewed the triage vital signs and the nursing notes.  Pertinent labs & imaging results that were available during my care of the patient were reviewed by me and considered in my medical decision making (see chart for details).     Patient X-Ray negative for obvious fracture or dislocation.  Pt advised to follow up with orthopedics. Patient given knee sleeve while in ED, conservative therapy recommended and discussed. Patient will be discharged home & is agreeable with above plan. Returns precautions discussed. Pt appears safe for discharge.   Final Clinical Impressions(s) / ED Diagnoses   Final diagnoses:  Contusion of right thigh, initial encounter    New Prescriptions New Prescriptions   No medications on file     Benjamin Santos, Cordelia Poche-C 09/15/16 0606    Benjamin KaplanNanavati, Ankit, MD 09/15/16 [phone]

## 2016-09-15 NOTE — ED Notes (Signed)
Bed: [facility] Expected date:  Expected time:  Means of arrival:  Comments: 

## 2016-09-15 NOTE — ED Triage Notes (Signed)
Pt complains of lateral thigh pain from jumping a curb and hitting a trailer hitch Pt is unable to bear weight on that leg

## 2018-09-28 ENCOUNTER — Emergency Department (HOSPITAL_COMMUNITY): Payer: Self-pay

## 2018-09-28 ENCOUNTER — Emergency Department (HOSPITAL_COMMUNITY)
Admission: EM | Admit: 2018-09-28 | Discharge: 2018-09-29 | Disposition: A | Discharge status: Home / Self Care | Payer: Self-pay | Attending: Emergency Medicine | Admitting: Emergency Medicine

## 2018-09-28 ENCOUNTER — Other Ambulatory Visit: Payer: Self-pay

## 2018-09-28 DIAGNOSIS — T1490XA Injury, unspecified, initial encounter: Secondary | ICD-10-CM

## 2018-09-28 DIAGNOSIS — Y9389 Activity, other specified: Secondary | ICD-10-CM | POA: Insufficient documentation

## 2018-09-28 DIAGNOSIS — Y92013 Bedroom of single-family (private) house as the place of occurrence of the external cause: Secondary | ICD-10-CM | POA: Insufficient documentation

## 2018-09-28 DIAGNOSIS — S43102A Unspecified dislocation of left acromioclavicular joint, initial encounter: Secondary | ICD-10-CM | POA: Insufficient documentation

## 2018-09-28 DIAGNOSIS — M25512 Pain in left shoulder: Secondary | ICD-10-CM

## 2018-09-28 DIAGNOSIS — Y998 Other external cause status: Secondary | ICD-10-CM | POA: Insufficient documentation

## 2018-09-28 DIAGNOSIS — F17200 Nicotine dependence, unspecified, uncomplicated: Secondary | ICD-10-CM | POA: Insufficient documentation

## 2018-09-28 DIAGNOSIS — X509XXA Other and unspecified overexertion or strenuous movements or postures, initial encounter: Secondary | ICD-10-CM | POA: Insufficient documentation

## 2018-09-28 NOTE — ED Triage Notes (Signed)
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 Pt is able to move it but very sore.

## 2018-09-28 NOTE — ED Provider Notes (Signed)
[facility] EMERGENCY DEPARTMENT Provider Note   CSN: [id number] Arrival date & time: 09/28/18  2216     History   Chief Complaint Chief Complaint  Patient presents with  . Shoulder Pain    HPI Benjamin Santos is a 28 y.o. male presents to the ER for evaluation of left shoulder pain that began suddenly on Tuesday while he was pushing himself off from the bed.  Described as sharp, localized to the top shoulder with radiation to the left neck. Constant. Mild.  It is worse with lifting his arm/shoulder.  It is better if he avoids moving his arm above his head.  No interventions.  No trauma but states that he lifts heavy boxes at work at [employer].  Also reports playing football and having AC joint separation on the right side and the pain today feels similar.  He denies any loss of sensation, weakness.  No redness warmth or fevers.  He is right-hand dominant.     HPI  No past medical history on file.  There are no active problems to display for this patient.   No past surgical history on file.      Home Medications    Prior to Admission medications   Medication Sig Start Date End Date Taking? Authorizing Provider  HYDROcodone-acetaminophen (NORCO/VICODIN) 5-325 MG per tablet Take 1 tablet by mouth every 6 (six) hours as needed for severe pain. 06/29/14   Varney Biles, MD  HYDROcodone-acetaminophen (NORCO/VICODIN) 5-325 MG tablet Take 2 tablets by mouth every 4 (four) hours as needed. 04/12/15   Dowless, Aldona Bar Tripp, PA-C  ibuprofen (ADVIL,MOTRIN) 600 MG tablet Take 1 tablet (600 mg total) by mouth every 6 (six) hours as needed. 06/29/14   Varney Biles, MD  methocarbamol (ROBAXIN) 500 MG tablet Take 1 tablet (500 mg total) by mouth 2 (two) times daily. 05/24/15   Barrett, Lahoma Crocker, PA-C  naproxen (NAPROSYN) 500 MG tablet Take 1 tablet (500 mg total) by mouth 2 (two) times daily. 05/24/15   Barrett, Lahoma Crocker, PA-C    Family History No family history on file.  Social  History Social History   Tobacco Use  . Smoking status: Current Every Day Smoker  . Smokeless tobacco: Never Used  Substance Use Topics  . Alcohol use: No  . Drug use: No     Allergies   Patient has no known allergies.   Review of Systems Review of Systems  Musculoskeletal: Positive for arthralgias.  All other systems reviewed and are negative.    Physical Exam Updated Vital Signs BP 127/66 (BP Location: Right Arm)   Pulse 79   Temp 98.1 F (36.7 C) (Oral)   Resp 18   SpO2 99%   Physical Exam Constitutional:      Appearance: He is well-developed.  HENT:     Head: Normocephalic.     Nose: Nose normal.  Eyes:     General: Lids are normal.  Neck:     Musculoskeletal: Normal range of motion.  Cardiovascular:     Rate and Rhythm: Normal rate.     Comments: 1+ radial pulses bilaterally  Pulmonary:     Effort: Pulmonary effort is normal. No respiratory distress.  Musculoskeletal: Normal range of motion.        General: Tenderness present.     Comments:  LEFT SHOULDER: mild TTP biceps groove and AC joint.  +Neer's and Hawkin's. Pain with flexion and abduction past 90 degrees. No obvious skin abnormalities including abrasions, ecchymosis, erythema, edema  No point tenderness to sternum, anterior chest wall, scapula, clavicle, Allen joint. No tenderness to trapezius, deltoid, biceps tendon. Negative Speed's and Yergason's test. Negative drop arm test.  Neurological:     Mental Status: He is alert.     Comments: Sensation and strength intact in LUE   Psychiatric:        Behavior: Behavior normal.      ED Treatments / Results  Labs (all labs ordered are listed, but only abnormal results are displayed) Labs Reviewed - No data to display  EKG None  Radiology Dg Shoulder Left  Result Date: 09/28/2018 CLINICAL DATA:  [age] year old male with sensation of pop in the left shoulder. EXAM: LEFT SHOULDER - 2+ VIEW COMPARISON:  Left shoulder radiograph dated 04/12/2015  FINDINGS: There is no acute fracture or dislocation. The bones are well mineralized. Slight widening of the left AC joint similar to prior radiograph. The soft tissues are unremarkable. IMPRESSION: No acute findings. Electronically Signed   By: Elgie CollardArash  Radparvar M.D.   On: 09/28/2018 22:46    Procedures Procedures (including critical care time)  Medications Ordered in ED Medications - No data to display   Initial Impression / Assessment and Plan / ED Course  I have reviewed the triage vital signs and the nursing notes.  Pertinent labs & imaging results that were available during my care of the patient were reviewed by me and considered in my medical decision making (see chart for details).  [age] year old is here for atraumatic left shoulder pain.  Right-hand-dominant.  He lifts heavy boxes at [employer] but denies any recent injury.  Heard a pop when he was pushing off from bed.  Extremity is neurovascularly intact.  No signs of overlying joint cellulitis.  No fevers.  MSK exam as above suggestive of MSK overuse injury.  History and exam is not consistent with septic arthritis, gout.  X-ray obtained here reviewed by me and radiologist shows stable AC joint separation.  Discussed results with patient.  Discussed plan to discharge with rice, shoulder sling, early range of motion exercises.  NSAIDs for pain.  Follow-up with orthopedist for persistent symptoms.  Return precautions discussed.  Patient is comfortable with this. Final Clinical Impressions(s) / ED Diagnoses   Final diagnoses:  Acute pain of left shoulder  Separation of left acromioclavicular joint, initial encounter    ED Discharge Orders    None       Jerrell MylarGibbons, Keyonta Barradas J, PA-C 09/29/18 0005    Mesner, Barbara CowerJason, MD 09/29/18 [phone]

## 2018-09-29 NOTE — Discharge Instructions (Addendum)
You were seen in the ER for left shoulder pain.  X-ray shows mild separation of your [facility] joint which was seen previously.  I suspect you have some degree of mild AC joint separation from overuse injuries.  Treatment of AC joint separation includes rest, ice, elevation and early range of motion exercises to start strengthening the joint.  Alternate 600 mg of ibuprofen and/or 500 to 1000 mg of acetaminophen every 6-8 hours for the next 48 hours.  Ice.  Start doing light range of motion exercises and light weights with the shoulder to help strengthen the joint and prevent further separation and injury.  Follow-up with orthopedics in 7 to 10 days if your pain has not improved or it is starting to limit your work.  Return to the ER if there is redness warmth fevers to the joint, loss of sensation to the extremity or weakness.

## 2018-11-22 ENCOUNTER — Other Ambulatory Visit: Payer: Self-pay

## 2018-11-22 DIAGNOSIS — Z20822 Contact with and (suspected) exposure to covid-19: Secondary | ICD-10-CM

## 2018-11-24 LAB — NOVEL CORONAVIRUS, NAA: SARS-CoV-2, NAA: NOT DETECTED

## 2020-06-27 IMAGING — DX LEFT SHOULDER - 2+ VIEW
3 series · 3 of 3 positions shown · non-contrast
Comparison: Left shoulder radiograph dated 04/12/2015

CLINICAL DATA: 28-year-old male with sensation of pop in the left
shoulder.

EXAM:
LEFT SHOULDER - 2+ VIEW

[shoulder grashey]
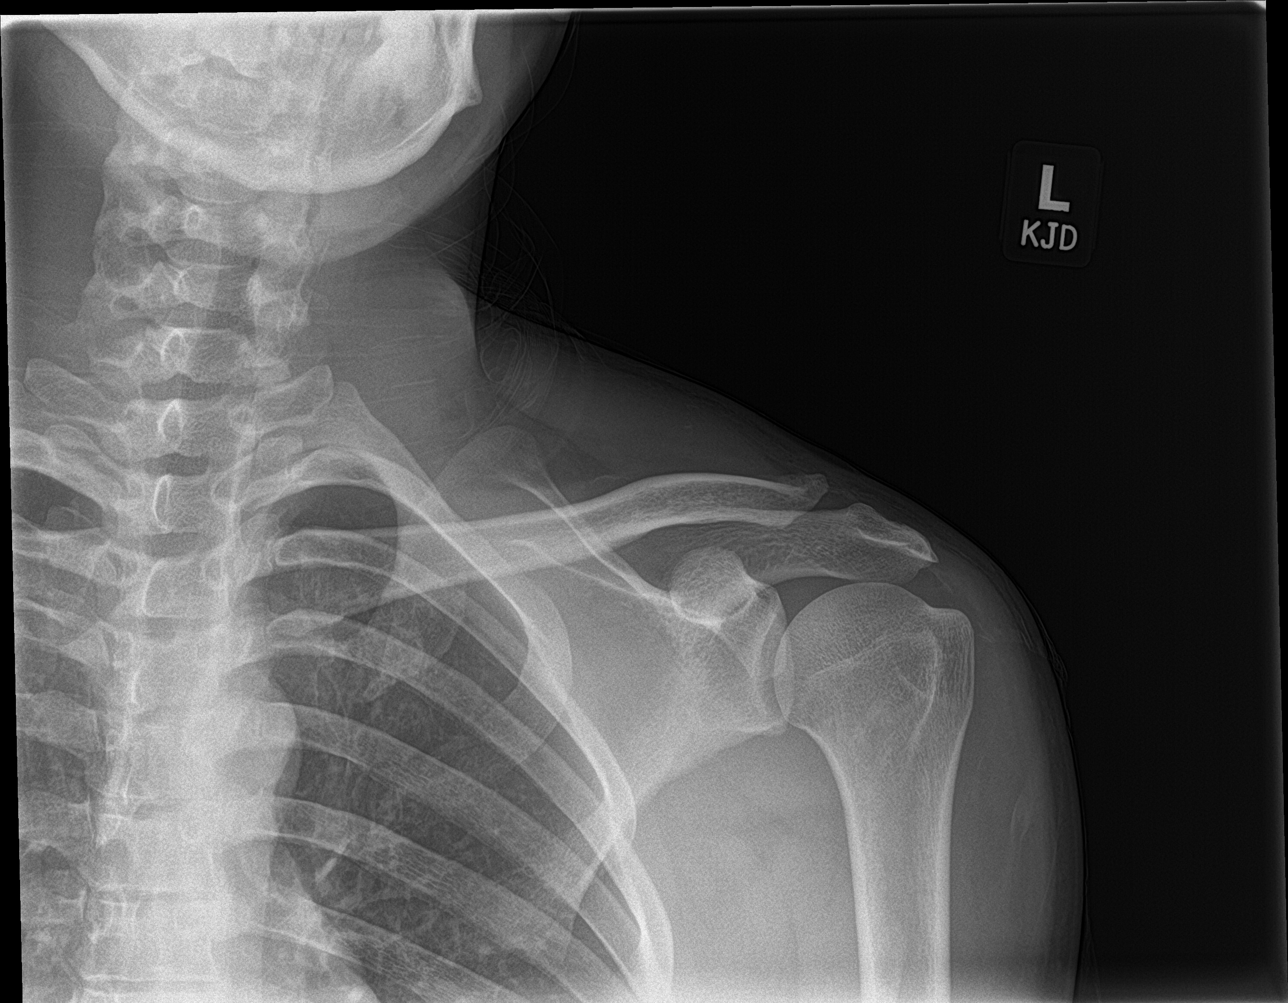

[shoulder y view]
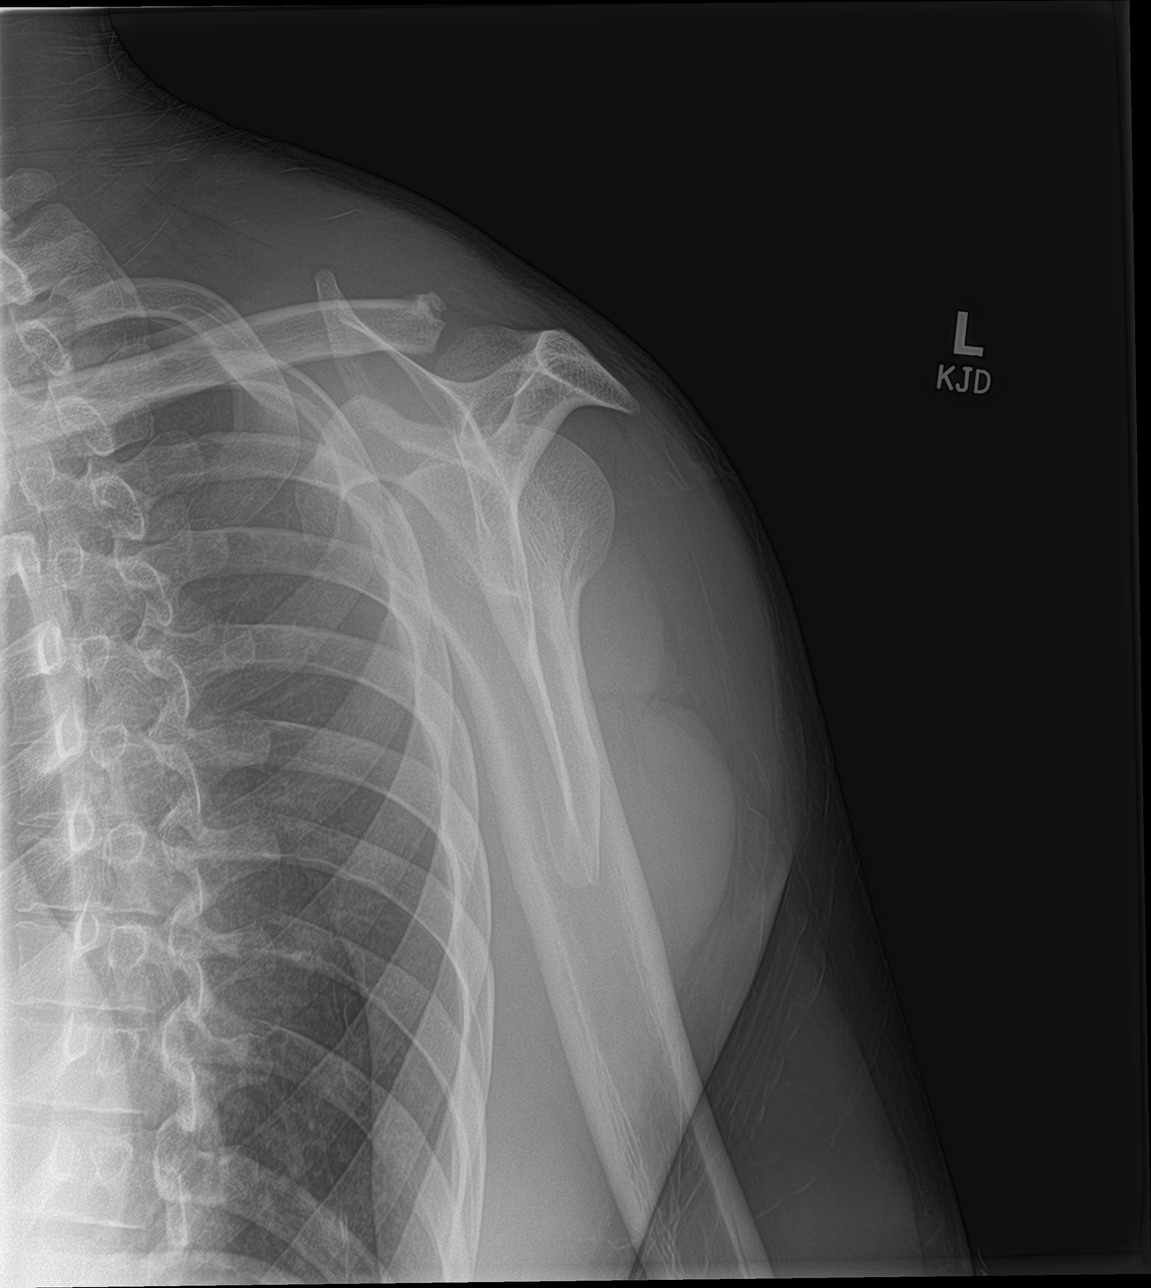

[shoulder ap neutral]
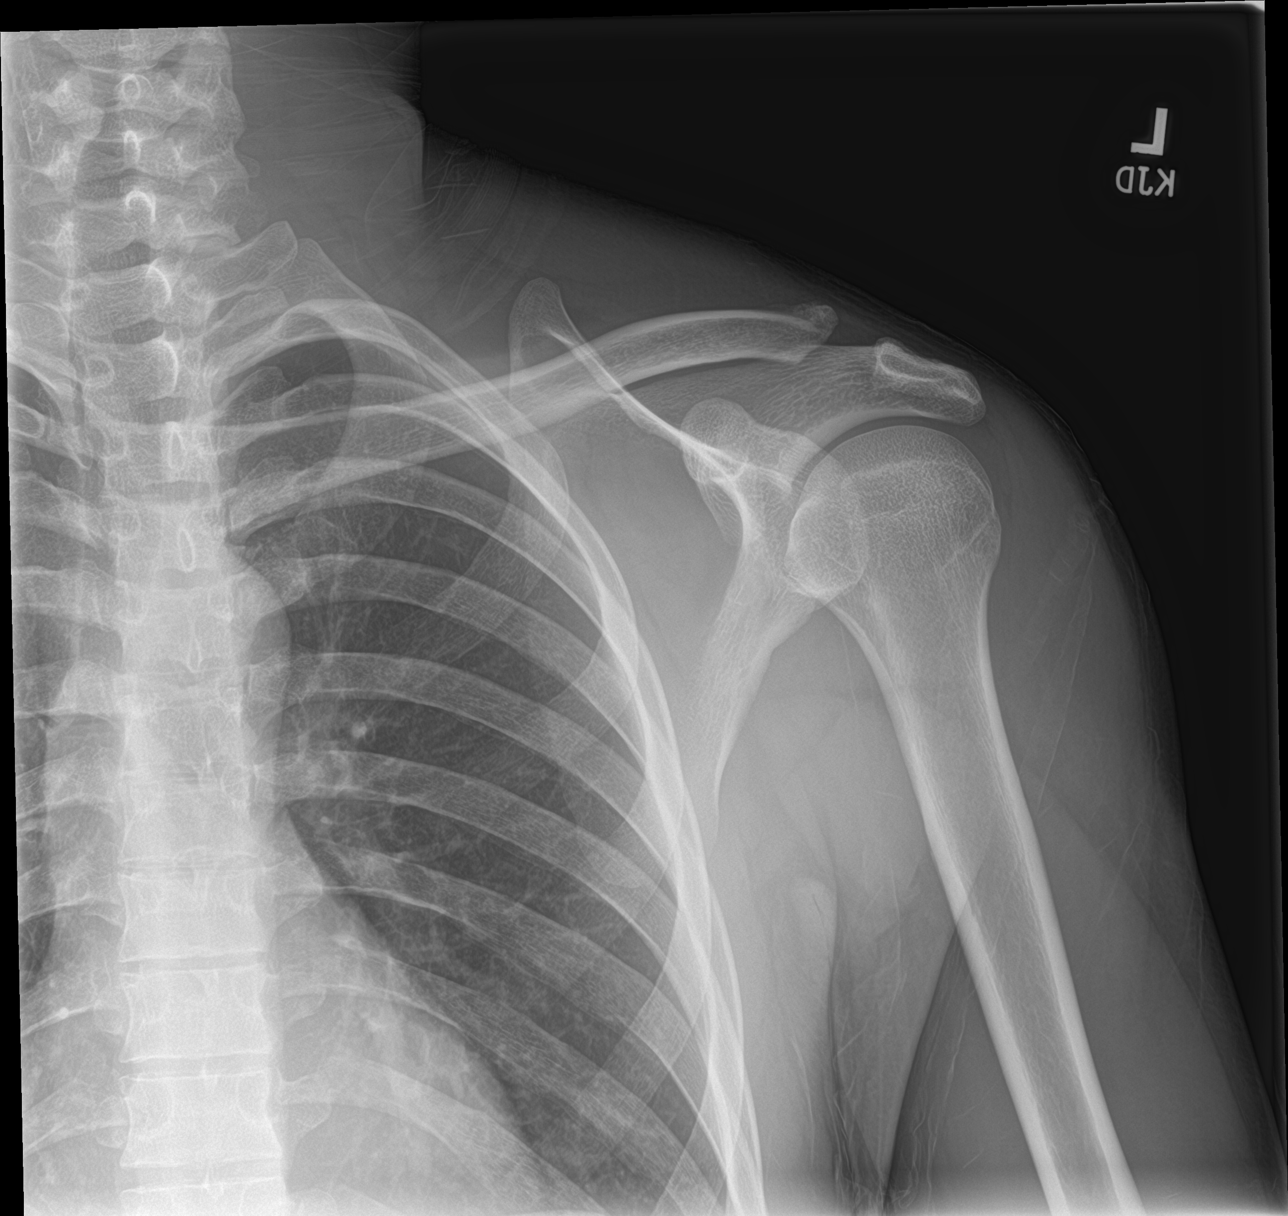

[3 of 3 positions shown; findings below may reference images not displayed]

FINDINGS: There is no acute fracture or dislocation. The bones are well
mineralized. Slight widening of the left AC joint similar to prior
radiograph. The soft tissues are unremarkable.
IMPRESSION: No acute findings.

## 2021-06-10 DIAGNOSIS — M62838 Other muscle spasm: Secondary | ICD-10-CM | POA: Diagnosis not present

## 2021-06-10 DIAGNOSIS — M67819 Other specified disorders of synovium and tendon, unspecified shoulder: Secondary | ICD-10-CM | POA: Diagnosis not present

## 2021-07-11 DIAGNOSIS — M25571 Pain in right ankle and joints of right foot: Secondary | ICD-10-CM | POA: Diagnosis not present

## 2021-07-11 DIAGNOSIS — S93402A Sprain of unspecified ligament of left ankle, initial encounter: Secondary | ICD-10-CM | POA: Diagnosis not present

## 2021-07-11 DIAGNOSIS — M25472 Effusion, left ankle: Secondary | ICD-10-CM | POA: Diagnosis not present

## 2021-07-13 DIAGNOSIS — S99912A Unspecified injury of left ankle, initial encounter: Secondary | ICD-10-CM | POA: Diagnosis not present

## 2021-07-13 DIAGNOSIS — M25572 Pain in left ankle and joints of left foot: Secondary | ICD-10-CM | POA: Diagnosis not present

## 2024-01-01 ENCOUNTER — Other Ambulatory Visit: Payer: Self-pay

## 2024-01-01 ENCOUNTER — Emergency Department (HOSPITAL_COMMUNITY)
Admission: EM | Admit: 2024-01-01 | Discharge: 2024-01-01 | Disposition: A | Discharge status: Home / Self Care | Payer: Self-pay | Attending: Emergency Medicine | Admitting: Emergency Medicine

## 2024-01-01 ENCOUNTER — Encounter (HOSPITAL_COMMUNITY): Payer: Self-pay | Admitting: Radiology

## 2024-01-01 DIAGNOSIS — K029 Dental caries, unspecified: Secondary | ICD-10-CM | POA: Insufficient documentation

## 2024-01-01 DIAGNOSIS — K0889 Other specified disorders of teeth and supporting structures: Secondary | ICD-10-CM

## 2024-01-01 MED ORDER — PENICILLIN V POTASSIUM 500 MG PO TABS: 500.0000 mg | ORAL_TABLET | Freq: Four times a day (QID) | ORAL | 0 refills | Status: AC

## 2024-01-01 MED ORDER — IBUPROFEN 600 MG PO TABS: 600.0000 mg | ORAL_TABLET | Freq: Four times a day (QID) | ORAL | 0 refills | Status: AC | PRN

## 2024-01-01 NOTE — ED Triage Notes (Signed)
 PT complains of tooth pain for the past 3 months but for the past 2 days the pain is now in his jaw on the right side. Denies fever.

## 2024-01-01 NOTE — ED Provider Notes (Signed)
 [facility] EMERGENCY DEPARTMENT AT [facility] Provider Note   CSN: [id number] Arrival date & time: 01/01/24  1732     Patient presents with: Dental Pain   COTEY RAKES is a 33 y.o. male.   The history is provided by the patient and medical records. No language interpreter was used.  Dental Pain Associated symptoms: no fever     33 year old Male presenting with complaints of dental pain.  Patient reported on and off pain to his right lower molar for the past several months.  Pain usually improved with ibuprofen .  However for the past 1 to 2 weeks he has had increasing pain to the same tooth now radiate down to his jaw.  Increased pain with chewing with temperature changes.  No fever no trouble swallowing no hearing changes and no headache.  He does not have a dentist.  He denies any trauma.  Prior to Admission medications   Medication Sig Start Date End Date Taking? Authorizing Provider  HYDROcodone -acetaminophen  (NORCO/VICODIN) 5-325 MG per tablet Take 1 tablet by mouth every 6 (six) hours as needed for severe pain. 06/29/14   Charlyn Sora, MD  HYDROcodone -acetaminophen  (NORCO/VICODIN) 5-325 MG tablet Take 2 tablets by mouth every 4 (four) hours as needed. 04/12/15   Dowless, Samantha Tripp, PA-C  ibuprofen  (ADVIL ,MOTRIN ) 600 MG tablet Take 1 tablet (600 mg total) by mouth every 6 (six) hours as needed. 06/29/14   Charlyn Sora, MD  methocarbamol  (ROBAXIN ) 500 MG tablet Take 1 tablet (500 mg total) by mouth 2 (two) times daily. 05/24/15   Barrett, Mimi, PA-C  naproxen  (NAPROSYN ) 500 MG tablet Take 1 tablet (500 mg total) by mouth 2 (two) times daily. 05/24/15   Barrett, Mimi, PA-C    Allergies: Patient has no known allergies.    Review of Systems  Constitutional:  Negative for fever.  HENT:  Positive for dental problem.     Updated Vital Signs BP 119/72   Pulse 66   Temp (!) 97.4 F (36.3 C)   Resp 16   Ht 6' 1 (1.854 m)   Wt 98 kg   SpO2 100%   BMI 28.50  kg/m   Physical Exam Constitutional:      General: He is not in acute distress.    Appearance: He is well-developed.  HENT:     Head: Atraumatic.     Mouth/Throat:     Comments: Mouth: Tenderness to tooth #31 and 32 with some mild dental decay noted.  No gingival erythema no dental abscess or facial involvement.  No stridor or trismus. Eyes:     Conjunctiva/sclera: Conjunctivae normal.  Musculoskeletal:     Cervical back: Normal range of motion and neck supple.  Skin:    Findings: No rash.  Neurological:     Mental Status: He is alert.     (all labs ordered are listed, but only abnormal results are displayed) Labs Reviewed - No data to display  EKG: None  Radiology: No results found.   Procedures   Medications Ordered in the ED - No data to display                                  Medical Decision Making  BP 119/72   Pulse 66   Temp (!) 97.4 F (36.3 C)   Resp 16   Ht 6' 1 (1.854 m)   Wt 98 kg   SpO2 100%  BMI 28.50 kg/m   65:25 PM 33 year old Male presenting with complaints of dental pain.  Patient reported on and off pain to his right lower molar for the past several months.  Pain usually improved with ibuprofen .  However for the past 1 to 2 weeks he has had increasing pain to the same tooth now radiate down to his jaw.  Increased pain with chewing with temperature changes.  No fever no trouble swallowing no hearing changes and no headache.  He does not have a dentist.  He denies any trauma.  On exam, some tenderness noted to tooth #31 and 32 but no obvious abscess or facial involvement.  This is likely an odontogenic source.  Will prescribe antibiotic and NSAIDs for symptom control.  Advanced imaging not indicated at this time.  Dental referral given.     Final diagnoses:  Pain, dental    ED Discharge Orders          Ordered    ibuprofen  (ADVIL ) 600 MG tablet  Every 6 hours PRN        01/01/24 1903    penicillin v potassium (VEETID) 500 MG  tablet  4 times daily        01/01/24 1903               Nivia Colon, PA-C 01/01/24 1904    Dean Clarity, MD 01/02/24 [phone]
